# Patient Record
Sex: Male | Born: 1965 | Race: White | Hispanic: No | Marital: Married | Smoking: Current some day smoker
Health system: Southern US, Community
[De-identification: ages and names within clinical notes are randomized; demographics above are authoritative.]

## PROBLEM LIST (undated history)

## (undated) DIAGNOSIS — K5792 Diverticulitis of intestine, part unspecified, without perforation or abscess without bleeding: Secondary | ICD-10-CM

## (undated) DIAGNOSIS — E785 Hyperlipidemia, unspecified: Secondary | ICD-10-CM

## (undated) HISTORY — DX: Diverticulitis of intestine, part unspecified, without perforation or abscess without bleeding: K57.92

## (undated) HISTORY — DX: Hyperlipidemia, unspecified: E78.5

## (undated) HISTORY — PX: WISDOM TOOTH EXTRACTION: SHX21

---

## 2015-07-24 ENCOUNTER — Encounter: Payer: Self-pay | Admitting: Gastroenterology

## 2015-09-28 ENCOUNTER — Encounter: Payer: Self-pay | Admitting: Internal Medicine

## 2015-10-06 ENCOUNTER — Encounter: Payer: Self-pay | Admitting: Gastroenterology

## 2015-11-07 ENCOUNTER — Encounter: Payer: Self-pay | Admitting: Gastroenterology

## 2015-11-23 DIAGNOSIS — M5412 Radiculopathy, cervical region: Secondary | ICD-10-CM | POA: Diagnosis not present

## 2015-11-23 DIAGNOSIS — M791 Myalgia: Secondary | ICD-10-CM | POA: Diagnosis not present

## 2015-11-23 DIAGNOSIS — M9902 Segmental and somatic dysfunction of thoracic region: Secondary | ICD-10-CM | POA: Diagnosis not present

## 2015-11-23 DIAGNOSIS — M9901 Segmental and somatic dysfunction of cervical region: Secondary | ICD-10-CM | POA: Diagnosis not present

## 2015-11-27 DIAGNOSIS — M9901 Segmental and somatic dysfunction of cervical region: Secondary | ICD-10-CM | POA: Diagnosis not present

## 2015-11-27 DIAGNOSIS — M9902 Segmental and somatic dysfunction of thoracic region: Secondary | ICD-10-CM | POA: Diagnosis not present

## 2015-11-27 DIAGNOSIS — M791 Myalgia: Secondary | ICD-10-CM | POA: Diagnosis not present

## 2015-11-27 DIAGNOSIS — M5412 Radiculopathy, cervical region: Secondary | ICD-10-CM | POA: Diagnosis not present

## 2015-11-28 DIAGNOSIS — M542 Cervicalgia: Secondary | ICD-10-CM | POA: Diagnosis not present

## 2016-01-04 ENCOUNTER — Ambulatory Visit (AMBULATORY_SURGERY_CENTER): Payer: Self-pay

## 2016-01-04 ENCOUNTER — Encounter: Payer: Self-pay | Admitting: Gastroenterology

## 2016-01-04 ENCOUNTER — Encounter (INDEPENDENT_AMBULATORY_CARE_PROVIDER_SITE_OTHER): Payer: Self-pay

## 2016-01-04 VITALS — Ht 76.0 in | Wt 224.6 lb

## 2016-01-04 DIAGNOSIS — Z1211 Encounter for screening for malignant neoplasm of colon: Secondary | ICD-10-CM

## 2016-01-04 MED ORDER — SUPREP BOWEL PREP KIT 17.5-3.13-1.6 GM/177ML PO SOLN: 1.0000 | Freq: Once | ORAL | 0 refills | Status: AC

## 2016-01-04 NOTE — Progress Notes (Signed)
No allergies to eggs or soy No past problems with anesthesia No diet meds No home oxygen  Has email and internet; declined emmi 

## 2016-01-09 ENCOUNTER — Ambulatory Visit (AMBULATORY_SURGERY_CENTER): Payer: BLUE CROSS/BLUE SHIELD | Admitting: Gastroenterology

## 2016-01-09 ENCOUNTER — Encounter: Payer: Self-pay | Admitting: Gastroenterology

## 2016-01-09 VITALS — BP 96/66 | HR 61 | Temp 97.8°F | Resp 14 | Ht 76.0 in | Wt 224.0 lb

## 2016-01-09 DIAGNOSIS — Z1211 Encounter for screening for malignant neoplasm of colon: Secondary | ICD-10-CM | POA: Diagnosis not present

## 2016-01-09 MED ORDER — SODIUM CHLORIDE 0.9 % IV SOLN: 500.0000 mL | INTRAVENOUS | Status: DC

## 2016-01-09 NOTE — Op Note (Signed)
[facility] Patient Name: Anthony Shah Procedure Date: 01/09/2016 9:49 AM MRN: [id number] Endoscopist: Sherilyn Cooter L. Myrtie Neither , MD Age: 50 Referring MD:  Date of Birth: 02/13/1966 Gender: Male Account #: [id number] Procedure:                Colonoscopy Indications:              Screening for colorectal malignant neoplasm, This                            is the patient's first colonoscopy Medicines:                Monitored Anesthesia Care Procedure:                Pre-Anesthesia Assessment:                           - Prior to the procedure, a History and Physical                            was performed, and patient medications and                            allergies were reviewed. The patient's tolerance of                            previous anesthesia was also reviewed. The risks                            and benefits of the procedure and the sedation                            options and risks were discussed with the patient.                            All questions were answered, and informed consent                            was obtained. Prior Anticoagulants: The patient has                            taken no previous anticoagulant or antiplatelet                            agents. ASA Grade Assessment: II - A patient with                            mild systemic disease. After reviewing the risks                            and benefits, the patient was deemed in                            satisfactory condition to undergo the procedure.  After obtaining informed consent, the colonoscope                            was passed under direct vision. Throughout the                            procedure, the patient's blood pressure, pulse, and                            oxygen saturations were monitored continuously. The                            Model CF-HQ190L [id number]) scope was introduced                            through the anus and advanced to  the the cecum,                            identified by appendiceal orifice and ileocecal                            valve. The colonoscopy was performed without                            difficulty. The patient tolerated the procedure                            well. The quality of the bowel preparation was                            excellent. The ileocecal valve, appendiceal                            orifice, and rectum were photographed. Scope In: 9:59:25 AM Scope Out: 10:10:39 AM Scope Withdrawal Time: 0 hours 8 minutes 10 seconds  Total Procedure Duration: 0 hours 11 minutes 14 seconds  Findings:                 The perianal and digital rectal examinations were                            normal.                           Internal hemorrhoids were found during                            retroflexion. The hemorrhoids were small and Grade                            I (internal hemorrhoids that do not prolapse).                           The exam was otherwise without abnormality. Complications:            No immediate  complications. Estimated Blood Loss:     Estimated blood loss: none. Impression:               - Internal hemorrhoids.                           - The examination was otherwise normal.                           - No specimens collected. Recommendation:           - Patient has a contact number available for                            emergencies. The signs and symptoms of potential                            delayed complications were discussed with the                            patient. Return to normal activities tomorrow.                            Written discharge instructions were provided to the                            patient.                           - Resume previous diet.                           - Continue present medications.                           - Repeat colonoscopy in 10 years for screening                            purposes. Meshach Perry L.  Myrtie Neither, MD 01/09/2016 10:12:57 AM This report has been signed electronically.

## 2016-01-09 NOTE — Progress Notes (Signed)
To pacu vss paten aw reprort to rn

## 2016-01-09 NOTE — Patient Instructions (Signed)
YOU HAD AN ENDOSCOPIC PROCEDURE TODAY AT THE  ENDOSCOPY CENTER:   Refer to the procedure report that was given to you for any specific questions about what was found during the examination.  If the procedure report does not answer your questions, please call your gastroenterologist to clarify.  If you requested that your care partner not be given the details of your procedure findings, then the procedure report has been included in a sealed envelope for you to review at your convenience later.  YOU SHOULD EXPECT: Some feelings of bloating in the abdomen. Passage of more gas than usual.  Walking can help get rid of the air that was put into your GI tract during the procedure and reduce the bloating. If you had a lower endoscopy (such as a colonoscopy or flexible sigmoidoscopy) you [place] notice spotting of blood in your stool or on the toilet paper. If you underwent a bowel prep for your procedure, you Nix not have a normal bowel movement for a few days.  Please Note:  You might notice some irritation and congestion in your nose or some drainage.  This is from the oxygen used during your procedure.  There is no need for concern and it should clear up in a day or so.  SYMPTOMS TO REPORT IMMEDIATELY:   Following lower endoscopy (colonoscopy or flexible sigmoidoscopy):  Excessive amounts of blood in the stool  Significant tenderness or worsening of abdominal pains  Swelling of the abdomen that is new, acute  Fever of 100F or higher    For urgent or emergent issues, a gastroenterologist can be reached at any hour by calling [phone].   DIET: Your first meal following the procedure should be a small meal and then it is ok to progress to your normal diet. Heavy or fried foods are harder to digest and Buege make you feel nauseous or bloated.  Likewise, meals heavy in dairy and vegetables can increase bloating.  Drink plenty of fluids but you should avoid alcoholic beverages for 24  hours.  ACTIVITY:  You should plan to take it easy for the rest of today and you should NOT DRIVE or use heavy machinery until tomorrow (because of the sedation medicines used during the test).    FOLLOW UP: Our staff will call the number listed on your records the next business day following your procedure to check on you and address any questions or concerns that you [place] have regarding the information given to you following your procedure. If we do not reach you, we will leave a message.  However, if you are feeling well and you are not experiencing any problems, there is no need to return our call.  We will assume that you have returned to your regular daily activities without incident.  If any biopsies were taken you will be contacted by phone or by letter within the next 1-3 weeks.  Please call us at [phone] if you have not heard about the biopsies in 3 weeks.    SIGNATURES/CONFIDENTIALITY: You and/or your care partner have signed paperwork which will be entered into your electronic medical record.  These signatures attest to the fact that that the information above on your After Visit Summary has been reviewed and is understood.  Full responsibility of the confidentiality of this discharge information lies with you and/or your care-partner.   Resume medications. Information given on hemorrhoids and high fiber diet. 

## 2016-01-10 ENCOUNTER — Telehealth: Payer: Self-pay

## 2016-01-10 NOTE — Telephone Encounter (Signed)
  Follow up Call-  Call back number 01/09/2016  Post procedure Call Back phone  # [phone]  Permission to leave phone message Yes  Some recent data might be hidden     Patient questions:  Do you have a fever, pain , or abdominal swelling? No. Pain Score  0 *  Have you tolerated food without any problems? Yes.    Have you been able to return to your normal activities? Yes.    Do you have any questions about your discharge instructions: Diet   No. Medications  No. Follow up visit  No.  Do you have questions or concerns about your Care? No.  Actions: * If pain score is 4 or above: No action needed, pain <4.

## 2016-02-23 ENCOUNTER — Encounter (HOSPITAL_COMMUNITY): Payer: Self-pay

## 2016-02-23 ENCOUNTER — Emergency Department (HOSPITAL_COMMUNITY)
Admission: EM | Admit: 2016-02-23 | Discharge: 2016-02-24 | Disposition: A | Discharge status: Home / Self Care | Payer: BLUE CROSS/BLUE SHIELD | Attending: Emergency Medicine | Admitting: Emergency Medicine

## 2016-02-23 ENCOUNTER — Encounter (HOSPITAL_COMMUNITY): Payer: Self-pay | Admitting: Emergency Medicine

## 2016-02-23 ENCOUNTER — Emergency Department: Payer: BLUE CROSS/BLUE SHIELD

## 2016-02-23 ENCOUNTER — Emergency Department (HOSPITAL_COMMUNITY)
Admit: 2016-02-23 | Discharge: 2016-02-23 | Disposition: A | Discharge status: Home / Self Care | Payer: BLUE CROSS/BLUE SHIELD | Attending: Emergency Medicine | Admitting: Emergency Medicine

## 2016-02-23 ENCOUNTER — Encounter (HOSPITAL_COMMUNITY): Payer: Self-pay | Admitting: Radiology

## 2016-02-23 DIAGNOSIS — K5792 Diverticulitis of intestine, part unspecified, without perforation or abscess without bleeding: Secondary | ICD-10-CM | POA: Diagnosis not present

## 2016-02-23 DIAGNOSIS — F17290 Nicotine dependence, other tobacco product, uncomplicated: Secondary | ICD-10-CM | POA: Diagnosis not present

## 2016-02-23 DIAGNOSIS — R1032 Left lower quadrant pain: Secondary | ICD-10-CM | POA: Diagnosis present

## 2016-02-23 DIAGNOSIS — K5732 Diverticulitis of large intestine without perforation or abscess without bleeding: Secondary | ICD-10-CM

## 2016-02-23 LAB — COMPREHENSIVE METABOLIC PANEL
ALBUMIN: 4.1 g/dL (ref 3.5–5.0)
ALT: 31 U/L (ref 17–63)
AST: 29 U/L (ref 15–41)
Alkaline Phosphatase: 71 U/L (ref 38–126)
Anion gap: 6 (ref 5–15)
BUN: 18 mg/dL (ref 6–20)
CHLORIDE: 106 mmol/L (ref 101–111)
CO2: 28 mmol/L (ref 22–32)
Calcium: 9.1 mg/dL (ref 8.9–10.3)
Creatinine, Ser: 1.22 mg/dL (ref 0.61–1.24)
GFR calc Af Amer: >60 mL/min (ref >60)
GFR calc non Af Amer: >60 mL/min (ref >60)
GLUCOSE: 111 mg/dL — AB (ref 65–99)
POTASSIUM: 4 mmol/L (ref 3.5–5.1)
Sodium: 140 mmol/L (ref 135–145)
Total Bilirubin: 0.7 mg/dL (ref 0.3–1.2)
Total Protein: 7 g/dL (ref 6.5–8.1)

## 2016-02-23 LAB — CBC WITH DIFFERENTIAL/PLATELET
BASOS ABS: 0 10*3/uL (ref 0.0–0.1)
BASOS PCT: 0 %
EOS PCT: 2 %
Eosinophils Absolute: 0.2 10*3/uL (ref 0.0–0.7)
HCT: 43.9 % (ref 39.0–52.0)
Hemoglobin: 15 g/dL (ref 13.0–17.0)
Lymphocytes Relative: 19 %
Lymphs Abs: 2.2 10*3/uL (ref 0.7–4.0)
MCH: 31.8 pg (ref 26.0–34.0)
MCHC: 34.2 g/dL (ref 30.0–36.0)
MCV: 93.2 fL (ref 78.0–100.0)
MONO ABS: 1.4 10*3/uL — AB (ref 0.1–1.0)
Monocytes Relative: 12 %
NEUTROS ABS: 7.7 10*3/uL (ref 1.7–7.7)
Neutrophils Relative %: 67 %
PLATELETS: 133 10*3/uL — AB (ref 150–400)
RBC: 4.71 MIL/uL (ref 4.22–5.81)
RDW: 12.6 % (ref 11.5–15.5)
WBC: 11.4 10*3/uL — ABNORMAL HIGH (ref 4.0–10.5)

## 2016-02-23 LAB — URINALYSIS, ROUTINE W REFLEX MICROSCOPIC
Bilirubin Urine: NEGATIVE
GLUCOSE, UA: NEGATIVE mg/dL
Hgb urine dipstick: NEGATIVE
Ketones, ur: NEGATIVE mg/dL
LEUKOCYTES UA: NEGATIVE
Nitrite: NEGATIVE
PH: 7 (ref 5.0–8.0)
PROTEIN: NEGATIVE mg/dL
Specific Gravity, Urine: 1.021 (ref 1.005–1.030)

## 2016-02-23 LAB — LIPASE, BLOOD: Lipase: 23 U/L (ref 11–51)

## 2016-02-23 MED ORDER — KETOROLAC TROMETHAMINE 30 MG/ML IJ SOLN
30.0000 mg | Freq: Once | INTRAMUSCULAR | Status: AC
  Administered 2016-02-23: 30 mg via INTRAVENOUS
  Filled 2016-02-23: qty 1

## 2016-02-23 MED ORDER — ONDANSETRON HCL 4 MG/2ML IJ SOLN
4.0000 mg | Freq: Once | INTRAMUSCULAR | Status: DC
  Filled 2016-02-23: qty 2

## 2016-02-23 MED ORDER — IOPAMIDOL (ISOVUE-300) INJECTION 61%
15.0000 mL | Freq: Once | INTRAVENOUS | Status: AC | PRN
  Administered 2016-02-24: 15 mL via ORAL

## 2016-02-23 MED ORDER — SODIUM CHLORIDE 0.9 % IV BOLUS (SEPSIS)
1000.0000 mL | Freq: Once | INTRAVENOUS | Status: AC
  Administered 2016-02-23: 1000 mL via INTRAVENOUS

## 2016-02-23 MED ORDER — IOPAMIDOL (ISOVUE-300) INJECTION 61%
100.0000 mL | Freq: Once | INTRAVENOUS | Status: AC | PRN
  Administered 2016-02-24: 100 mL via INTRAVENOUS

## 2016-02-23 NOTE — ED Triage Notes (Signed)
Pt states he has been feeling unwell since Wed.  Started as just being "aware" of some pain that came and went.  Today left lower quadrant  pain has been constant since around lunchtime.  No fevers. No nasuea, vomiting, diarrhea.  Worse with movement.  Chills after dinner tonight.

## 2016-02-23 NOTE — ED Notes (Signed)
RN Ati to draw labs with iv start.

## 2016-02-23 NOTE — ED Provider Notes (Signed)
[facility]-EMERGENCY DEPT Provider Note   CSN: [id number] Arrival date & time: 02/23/16  2036  By signing my name below, I, Christy Sartorius, attest that this documentation has been prepared under the direction and in the presence of  [employer], [place]. Electronically Signed: Christy Sartorius, ED Scribe. 02/23/16. 10:18 PM.  History   Chief Complaint Chief Complaint  Patient presents with  . Abdominal Pain   The history is provided by the patient and medical records. No language interpreter was used.    HPI Comments:  Anthony Shah is a 50 y.o. male with a history of HCL who presents to the Emergency Department complaining of left lower quadrant abdominal pain that beginning 2 days ago.  He describes his pain as cramping and states it became more intense today at 1100 and was worse after dinner. He further notes his pain is achy when he lies still and sharper when he moves. He also reports chills this evening. Two days ago he took an imodium and Tylenol with some relief.  He also endorses taking Naproxen yesterday. His last bowel movement was this morning and he reports it was normal. He reports additional urges to defecate, but could not. He had a colonoscopy done on 01/09/16 and states "it was clean".  He denies fever, melena, hematochezia, nausea, vomiting, hematuria and dysuria.         Past Medical History:  Diagnosis Date  . Hyperlipidemia     There are no active problems to display for this patient.   Past Surgical History:  Procedure Laterality Date  . WISDOM TOOTH EXTRACTION       Home Medications    Prior to Admission medications   Medication Sig Start Date End Date Taking? Authorizing Provider  ezetimibe (ZETIA) 10 MG tablet Take 10 mg by mouth daily.   Yes Historical Provider, MD  rosuvastatin (CRESTOR) 10 MG tablet Take 10 mg by mouth daily.   Yes Historical Provider, MD  zolpidem (AMBIEN) 10 MG tablet Take 10 mg by mouth at bedtime as needed for sleep.   Yes  Historical Provider, MD    Family History Family History  Problem Relation Age of Onset  . Crohn's disease Brother   . Esophageal cancer Paternal Uncle   . Colon cancer Neg Hx     Social History Social History  Substance Use Topics  . Smoking status: Current Some Day Smoker    Types: Cigars  . Smokeless tobacco: Never Used  . Alcohol use 7.8 oz/week    6 Cans of beer, 7 Shots of liquor per week     Allergies   Penicillins   Review of Systems Review of Systems  Gastrointestinal: Positive for abdominal pain.  10 systems reviewed and all are negative for acute change except as noted in the HPI.   Physical Exam Updated Vital Signs BP 123/80 (BP Location: Left Arm)   Pulse 92   Temp 98.9 F (37.2 C) (Oral)   Resp 18   SpO2 99%   Physical Exam  Constitutional: He is oriented to person, place, and time. He appears well-developed and well-nourished. No distress.  Nontoxic and in no distress  HENT:  Head: Normocephalic and atraumatic.  Eyes: Conjunctivae and EOM are normal. No scleral icterus.  Neck: Normal range of motion.  Cardiovascular: Normal rate, regular rhythm and intact distal pulses.   Pulmonary/Chest: Effort normal. No respiratory distress. He has no wheezes. He has no rales.  Respirations even and unlabored. Lungs clear bilaterally.  Abdominal:  Soft. He exhibits no distension and no mass. There is tenderness. There is no rebound.  Soft abdomen with focal tenderness in the left lower quadrant and associated voluntary guarding. No masses.  Musculoskeletal: Normal range of motion.  Neurological: He is alert and oriented to person, place, and time.  Skin: Skin is warm and dry. No rash noted. He is not diaphoretic. No erythema. No pallor.  Psychiatric: He has a normal mood and affect. His behavior is normal.  Nursing note and vitals reviewed.    ED Treatments / Results   DIAGNOSTIC STUDIES:  Oxygen Saturation is 99% on RA, NML by my interpretation.     COORDINATION OF CARE:  10:18 PM  Discussed treatment plan with pt at bedside and pt agreed to plan.  Labs (all labs ordered are listed, but only abnormal results are displayed) Labs Reviewed  CBC WITH DIFFERENTIAL/PLATELET - Abnormal; Notable for the following:       Result Value   WBC 11.4 (*)    Platelets 133 (*)    Monocytes Absolute 1.4 (*)    All other components within normal limits  COMPREHENSIVE METABOLIC PANEL - Abnormal; Notable for the following:    Glucose, Bld 111 (*)    All other components within normal limits  LIPASE, BLOOD  URINALYSIS, ROUTINE W REFLEX MICROSCOPIC ([facility])    EKG  EKG Interpretation None       Radiology Ct Abdomen Pelvis W Contrast  Result Date: 02/24/2016 CLINICAL DATA:  Left lower quadrant pain. EXAM: CT ABDOMEN AND PELVIS WITH CONTRAST TECHNIQUE: Multidetector CT imaging of the abdomen and pelvis was performed using the standard protocol following bolus administration of intravenous contrast. CONTRAST:  100mL ISOVUE-300 IOPAMIDOL (ISOVUE-300) IV COMPARISON:  None. FINDINGS: Lower chest: A 2.2 cm nodule in the medial right lower lobe with central popcorn calcification is consistent with a hamartoma. No pleural fluid or acute abnormality. Hepatobiliary: 2.7 cm gallstone in the gallbladder, no pericholecystic inflammation. No biliary dilatation. No focal hepatic lesion. Pancreas: No ductal dilatation or inflammation. Spleen: Normal in size without focal abnormality. Adrenals/Urinary Tract: Normal adrenal glands. Symmetric renal enhancement and excretion. No perinephric edema. No hydronephrosis. Bladder is physiologically distended without stone. Stomach/Bowel: Inflamed diverticulum with adjacent inflammatory soft tissue stranding and colonic wall thickening is seen at the junction of the descending and sigmoid colon. Minimal adjacent free fluid. There is no perforation or abscess. Additional noninflamed colonic diverticula are seen most  prominent distally. Trace hiatal hernia. Stomach and small bowel are otherwise normal. The appendix is normal. Vascular/Lymphatic: Aortic atherosclerosis. No enlarged abdominal or pelvic lymph nodes. Reproductive: Prostate is unremarkable. Other: No free air or free fluid. Tiny fat containing umbilical hernia. Musculoskeletal: There are no acute or suspicious osseous abnormalities. IMPRESSION: 1. Acute uncomplicated diverticulitis at the junction of the descending and sigmoid colon. 2. Incidental gallstone without gallbladder inflammation. 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 Electronically Signed   By: Rubye OaksMelanie  Ehinger M.D.   On: 02/24/2016 00:40    Procedures Procedures (including critical care time)  Medications Ordered in ED Medications  ondansetron [facility](ZOFRAN) injection 4 mg (4 mg Intravenous Refused 02/23/16 2227)  sodium chloride 0.9 % bolus 1,000 mL (0 mLs Intravenous Stopped 02/23/16 2308)  ketorolac (TORADOL) 30 MG/ML injection 30 mg (30 mg Intravenous Given 02/23/16 2227)     Initial Impression / Assessment and Plan / ED Course  I have reviewed the triage vital signs and the nursing notes.  Pertinent  labs & imaging results that were available during my care of the patient were reviewed by me and considered in my medical decision making (see chart for details).  Clinical Course    50 year old male present to the emergency department for left lower quadrant abdominal pain. He is afebrile with stable vital signs. Patient with mild leukocytosis and CT findings consistent with acute, uncomplicated diverticulitis. Pain well controlled with Toradol given in the emergency department. Plan to manage on an outpatient basis with ciprofloxacin and Flagyl. Primary care follow up advised and return precautions given. Patient discharged in satisfactory condition with no unaddressed concerns.   Final Clinical Impressions(s) / ED Diagnoses   Final  diagnoses:  Diverticulitis of large intestine without perforation or abscess without bleeding    New Prescriptions New Prescriptions   No medications on file    I personally performed the services described in this documentation, which was scribed in my presence. The recorded information has been reviewed and is accurate.       Antony Madura, PA-C 02/24/16 0234    Rolland Porter, MD 03/08/16 [phone]

## 2016-02-24 DIAGNOSIS — K5792 Diverticulitis of intestine, part unspecified, without perforation or abscess without bleeding: Secondary | ICD-10-CM | POA: Diagnosis not present

## 2016-02-24 MED ORDER — CIPROFLOXACIN HCL 500 MG PO TABS
500.0000 mg | ORAL_TABLET | Freq: Once | ORAL | Status: AC
  Administered 2016-02-24: 500 mg via ORAL
  Filled 2016-02-24: qty 1

## 2016-02-24 MED ORDER — CIPROFLOXACIN HCL 500 MG PO TABS: 500.0000 mg | ORAL_TABLET | Freq: Two times a day (BID) | ORAL | 0 refills | Status: DC

## 2016-02-24 MED ORDER — METRONIDAZOLE 500 MG PO TABS
500.0000 mg | ORAL_TABLET | Freq: Once | ORAL | Status: AC
  Administered 2016-02-24: 500 mg via ORAL
  Filled 2016-02-24: qty 1

## 2016-02-24 MED ORDER — METRONIDAZOLE 500 MG PO TABS: 500.0000 mg | ORAL_TABLET | Freq: Three times a day (TID) | ORAL | 0 refills | Status: DC

## 2016-02-27 DIAGNOSIS — Z6827 Body mass index (BMI) 27.0-27.9, adult: Secondary | ICD-10-CM | POA: Diagnosis not present

## 2016-02-27 DIAGNOSIS — K5792 Diverticulitis of intestine, part unspecified, without perforation or abscess without bleeding: Secondary | ICD-10-CM | POA: Diagnosis not present

## 2016-03-20 ENCOUNTER — Telehealth: Payer: Self-pay | Admitting: Gastroenterology

## 2016-03-20 MED ORDER — METRONIDAZOLE 500 MG PO TABS: 500.0000 mg | ORAL_TABLET | Freq: Three times a day (TID) | ORAL | 0 refills | Status: DC

## 2016-03-20 MED ORDER — CIPROFLOXACIN HCL 500 MG PO TABS: 500.0000 mg | ORAL_TABLET | Freq: Two times a day (BID) | ORAL | 0 refills | Status: DC

## 2016-03-20 NOTE — Telephone Encounter (Signed)
Spoke with pt and he is aware. Pt scheduled to see Mike GipAmy Esterwood PA 04/01/16@3pm , scripts sent to pharmacy.

## 2016-03-20 NOTE — Telephone Encounter (Signed)
Sorry to hear he is not feeling well.  I reviewed his labs and CT scan report from that day.  He Lory have not completely cleared the diverticulitis with that Abx course.  Please prescribe ciprofloxacin 500 mg, one tablet twice daily x 10 days and metronidazole 500 mg, one tablet three times daily x 10 days. I will be out of the office next week and the week after.  Please put him in to see one of the APPs during that time.

## 2016-03-20 NOTE — Telephone Encounter (Signed)
Pt was seen in the ER 02/23/16 for diverticulitis and given cipro and flagyl for 10 days. He states that when he was seen he was having LLQ pain, he did not have any fever. Pt has finished the meds but is concerned because he continues to have some discomfort on his left side however it is higher and under his rib cage, states it sometimes goes around to his back. Pt wants to know what he should do. Please advise.

## 2016-04-01 ENCOUNTER — Encounter: Payer: Self-pay | Admitting: Physician Assistant

## 2016-04-01 ENCOUNTER — Ambulatory Visit (INDEPENDENT_AMBULATORY_CARE_PROVIDER_SITE_OTHER): Payer: BLUE CROSS/BLUE SHIELD | Admitting: Physician Assistant

## 2016-04-01 ENCOUNTER — Encounter (INDEPENDENT_AMBULATORY_CARE_PROVIDER_SITE_OTHER): Payer: Self-pay

## 2016-04-01 VITALS — BP 100/60 | HR 70 | Ht 76.0 in | Wt 218.0 lb

## 2016-04-01 DIAGNOSIS — K5732 Diverticulitis of large intestine without perforation or abscess without bleeding: Secondary | ICD-10-CM | POA: Diagnosis not present

## 2016-04-01 NOTE — Progress Notes (Signed)
Thank you for sending this case to me. I have reviewed the entire note, and the outlined plan seems appropriate.  

## 2016-04-01 NOTE — Progress Notes (Signed)
Subjective:    Patient ID: Anthony Shah, male    DOB: 01-Jul-1965, 50 y.o.   MRN: [id number]  HPI Arlys John is a pleasant 50 year old white male recently established with Dr. Myrtie Neither. He had undergone screening colonoscopy on 01/09/2016 which was  a negative  exam with the exception of internal hemorrhoids. Patient had an ER visit on 02/23/2016 with acute left lower abdominal pain 2 days, achy  in nature, constant and associated with chills. He underwent CT of the abdomen and pelvis which showed a 2.2 cm nodule in the right upper lobe consistent with a hamartoma and a 2.7 cm gallstone in the gallbladder without evidence of gallbladder wall thickening or pericholecystic fluid. He was noted to have an inflamed diverticulum with adjacent inflammatory soft tissue stranding at the junction of the descending and sigmoid colon consistent with diverticulitis. He also has a tiny fat containing umbilical hernia. Prescribed a 10 day course of Cipro and Flagyl which she completed. He says the Flagyl made him feel bad and gave him a bad  taste in his mouth. He called here on 03/20/2016 stating that he was still having some lower abdominal discomfort though much better than prior to the antibiotics. He was called in  an additional 10 days worth of Cipro and Flagyl which he  since completed. He says he feels much better at this point and is not having any abdominal discomfort. He has multiple questions regarding diverticulosis/diverticulitis and management.  Review of Systems Pertinent positive and negative review of systems were noted in the above HPI section.  All other review of systems was otherwise negative.  Outpatient Encounter Prescriptions as of 04/01/2016  Medication Sig  . ezetimibe (ZETIA) 10 MG tablet Take 10 mg by mouth daily.  . rosuvastatin (CRESTOR) 10 MG tablet Take 10 mg by mouth daily.  Marland Kitchen zolpidem (AMBIEN) 10 MG tablet Take 10 mg by mouth at bedtime as needed for sleep.  . [DISCONTINUED] ciprofloxacin  (CIPRO) 500 MG tablet Take 1 tablet (500 mg total) by mouth every 12 (twelve) hours.  . [DISCONTINUED] ciprofloxacin (CIPRO) 500 MG tablet Take 1 tablet (500 mg total) by mouth 2 (two) times daily.  . [DISCONTINUED] metroNIDAZOLE (FLAGYL) 500 MG tablet Take 1 tablet (500 mg total) by mouth 3 (three) times daily.  . [DISCONTINUED] metroNIDAZOLE (FLAGYL) 500 MG tablet Take 1 tablet (500 mg total) by mouth 3 (three) times daily.  . [DISCONTINUED] 0.9 %  sodium chloride infusion    No facility-administered encounter medications on file as of 04/01/2016.    Allergies  Allergen Reactions  . Penicillins Rash   There are no active problems to display for this patient.  Social History   Social History  . Marital status: Married    Spouse name: N/A  . Number of children: 4  . Years of education: N/A   Occupational History  . Scientist, research (medical)    Social History Main Topics  . Smoking status: Current Some Day Smoker    Types: Cigars  . Smokeless tobacco: Never Used  . Alcohol use 7.8 oz/week    6 Cans of beer, 7 Shots of liquor per week  . Drug use: No  . Sexual activity: Not on file   Other Topics Concern  . Not on file   Social History Narrative  . No narrative on file    Mr. Lakins family history includes Crohn's disease in his brother; Esophageal cancer in his paternal uncle.      Objective:  Vitals:   04/01/16 1507  BP: 100/60  Pulse: 70    Physical Exam  well-developed white male in no acute distress, pleasant blood pressure 100/60 pulse 70 BMI 26.5. HEENT nontraumatic normocephalic EOMI PERRLA sclera anicteric, Cardiovascular; regular rate and rhythm with S1-S2 no murmur rub or gallop, Pulmonary ;clear bilaterally, Abdomen; soft, nontender nondistended bowel sounds are active there is no palpable mass or hepatosplenomegaly, Rectal ;exam not done, Ext; no clubbing cyanosis or edema skin warm and dry, Neuropsych ;mood and affect appropriate       Assessment &  Plan:   #181  50 year old white male with the initial episode of acute diverticulitis sigmoid. Symptoms completely resolved after a 3 week course of antibiotics #2 single  large gallstone noted on CT  Plan; discussion today regarding diverticulosis, diverticulitis, and general management with high fiber diet, fiber supplementation, avoiding constipation. We discussed symptoms, and patient is aware he should call for any concerns regarding recurrent episodes. We also discussed cholelithiasis. He  has not had any symptoms that he is aware of. Discussed symptoms of biliary colic. For now watchful waiting is appropriate, surgical consultation should he develop any symptoms. He will follow up with Dr. Myrtie Neitheranis  or myself on an as-needed basis.  Merrianne Mccumbers Oswald HillockS Turon Kilmer PA-C 04/01/2016   Cc: Martha ClanShaw, William, MD

## 2016-04-01 NOTE — Patient Instructions (Signed)
Call office for any problems with recurrent diverticulitis.   We have provided you with pamphlets on Diveritulitis and one on Gallstones.

## 2016-07-16 DIAGNOSIS — Z125 Encounter for screening for malignant neoplasm of prostate: Secondary | ICD-10-CM | POA: Diagnosis not present

## 2016-07-16 DIAGNOSIS — R52 Pain, unspecified: Secondary | ICD-10-CM | POA: Diagnosis not present

## 2016-07-16 DIAGNOSIS — R8299 Other abnormal findings in urine: Secondary | ICD-10-CM | POA: Diagnosis not present

## 2016-07-16 DIAGNOSIS — Z0000 Encounter for general adult medical examination without abnormal findings: Secondary | ICD-10-CM | POA: Diagnosis not present

## 2016-07-23 DIAGNOSIS — E784 Other hyperlipidemia: Secondary | ICD-10-CM | POA: Diagnosis not present

## 2016-07-23 DIAGNOSIS — K219 Gastro-esophageal reflux disease without esophagitis: Secondary | ICD-10-CM | POA: Diagnosis not present

## 2016-07-23 DIAGNOSIS — I471 Supraventricular tachycardia: Secondary | ICD-10-CM | POA: Diagnosis not present

## 2016-07-23 DIAGNOSIS — Z0000 Encounter for general adult medical examination without abnormal findings: Secondary | ICD-10-CM | POA: Diagnosis not present

## 2016-07-23 DIAGNOSIS — Z1389 Encounter for screening for other disorder: Secondary | ICD-10-CM | POA: Diagnosis not present

## 2016-07-23 DIAGNOSIS — Z8719 Personal history of other diseases of the digestive system: Secondary | ICD-10-CM | POA: Diagnosis not present

## 2016-08-02 DIAGNOSIS — Z1212 Encounter for screening for malignant neoplasm of rectum: Secondary | ICD-10-CM | POA: Diagnosis not present

## 2016-12-12 DIAGNOSIS — M542 Cervicalgia: Secondary | ICD-10-CM | POA: Diagnosis not present

## 2017-01-16 DIAGNOSIS — M542 Cervicalgia: Secondary | ICD-10-CM | POA: Diagnosis not present

## 2017-01-17 DIAGNOSIS — M9902 Segmental and somatic dysfunction of thoracic region: Secondary | ICD-10-CM | POA: Diagnosis not present

## 2017-01-17 DIAGNOSIS — M5412 Radiculopathy, cervical region: Secondary | ICD-10-CM | POA: Diagnosis not present

## 2017-01-17 DIAGNOSIS — M9901 Segmental and somatic dysfunction of cervical region: Secondary | ICD-10-CM | POA: Diagnosis not present

## 2017-01-17 DIAGNOSIS — M791 Myalgia: Secondary | ICD-10-CM | POA: Diagnosis not present

## 2017-01-28 DIAGNOSIS — M9901 Segmental and somatic dysfunction of cervical region: Secondary | ICD-10-CM | POA: Diagnosis not present

## 2017-01-28 DIAGNOSIS — M5412 Radiculopathy, cervical region: Secondary | ICD-10-CM | POA: Diagnosis not present

## 2017-01-28 DIAGNOSIS — M791 Myalgia: Secondary | ICD-10-CM | POA: Diagnosis not present

## 2017-01-28 DIAGNOSIS — M9902 Segmental and somatic dysfunction of thoracic region: Secondary | ICD-10-CM | POA: Diagnosis not present

## 2017-02-06 DIAGNOSIS — M9902 Segmental and somatic dysfunction of thoracic region: Secondary | ICD-10-CM | POA: Diagnosis not present

## 2017-02-06 DIAGNOSIS — M9901 Segmental and somatic dysfunction of cervical region: Secondary | ICD-10-CM | POA: Diagnosis not present

## 2017-02-06 DIAGNOSIS — M5412 Radiculopathy, cervical region: Secondary | ICD-10-CM | POA: Diagnosis not present

## 2017-02-06 DIAGNOSIS — M791 Myalgia: Secondary | ICD-10-CM | POA: Diagnosis not present

## 2017-02-13 DIAGNOSIS — M5412 Radiculopathy, cervical region: Secondary | ICD-10-CM | POA: Diagnosis not present

## 2017-02-13 DIAGNOSIS — M9902 Segmental and somatic dysfunction of thoracic region: Secondary | ICD-10-CM | POA: Diagnosis not present

## 2017-02-13 DIAGNOSIS — M791 Myalgia: Secondary | ICD-10-CM | POA: Diagnosis not present

## 2017-02-13 DIAGNOSIS — M9901 Segmental and somatic dysfunction of cervical region: Secondary | ICD-10-CM | POA: Diagnosis not present

## 2017-02-17 DIAGNOSIS — M542 Cervicalgia: Secondary | ICD-10-CM | POA: Diagnosis not present

## 2017-02-20 DIAGNOSIS — M542 Cervicalgia: Secondary | ICD-10-CM | POA: Diagnosis not present

## 2017-02-25 DIAGNOSIS — M9901 Segmental and somatic dysfunction of cervical region: Secondary | ICD-10-CM | POA: Diagnosis not present

## 2017-02-25 DIAGNOSIS — M5412 Radiculopathy, cervical region: Secondary | ICD-10-CM | POA: Diagnosis not present

## 2017-02-25 DIAGNOSIS — M791 Myalgia: Secondary | ICD-10-CM | POA: Diagnosis not present

## 2017-02-25 DIAGNOSIS — M9902 Segmental and somatic dysfunction of thoracic region: Secondary | ICD-10-CM | POA: Diagnosis not present

## 2017-02-27 DIAGNOSIS — M542 Cervicalgia: Secondary | ICD-10-CM | POA: Diagnosis not present

## 2017-05-22 DIAGNOSIS — M542 Cervicalgia: Secondary | ICD-10-CM | POA: Diagnosis not present

## 2017-06-05 DIAGNOSIS — M542 Cervicalgia: Secondary | ICD-10-CM | POA: Diagnosis not present

## 2017-06-11 DIAGNOSIS — M50323 Other cervical disc degeneration at C6-C7 level: Secondary | ICD-10-CM | POA: Diagnosis not present

## 2017-06-11 DIAGNOSIS — M50322 Other cervical disc degeneration at C5-C6 level: Secondary | ICD-10-CM | POA: Diagnosis not present

## 2017-06-11 DIAGNOSIS — M5011 Cervical disc disorder with radiculopathy,  high cervical region: Secondary | ICD-10-CM | POA: Diagnosis not present

## 2017-06-16 ENCOUNTER — Other Ambulatory Visit: Payer: Self-pay | Admitting: Orthopedic Surgery

## 2017-06-16 DIAGNOSIS — M542 Cervicalgia: Secondary | ICD-10-CM

## 2017-06-19 ENCOUNTER — Ambulatory Visit
Admission: RE | Admit: 2017-06-19 | Discharge: 2017-06-19 | Disposition: A | Discharge status: Home / Self Care | Payer: BLUE CROSS/BLUE SHIELD | Source: Ambulatory Visit | Attending: Orthopedic Surgery | Admitting: Orthopedic Surgery

## 2017-06-19 DIAGNOSIS — M542 Cervicalgia: Secondary | ICD-10-CM

## 2017-06-19 DIAGNOSIS — M4802 Spinal stenosis, cervical region: Secondary | ICD-10-CM | POA: Diagnosis not present

## 2017-06-24 DIAGNOSIS — M5124 Other intervertebral disc displacement, thoracic region: Secondary | ICD-10-CM | POA: Diagnosis not present

## 2017-06-24 DIAGNOSIS — M4722 Other spondylosis with radiculopathy, cervical region: Secondary | ICD-10-CM | POA: Diagnosis not present

## 2017-06-24 DIAGNOSIS — M5414 Radiculopathy, thoracic region: Secondary | ICD-10-CM | POA: Diagnosis not present

## 2017-06-24 DIAGNOSIS — M542 Cervicalgia: Secondary | ICD-10-CM | POA: Diagnosis not present

## 2017-07-17 DIAGNOSIS — M542 Cervicalgia: Secondary | ICD-10-CM | POA: Diagnosis not present

## 2017-07-22 DIAGNOSIS — M542 Cervicalgia: Secondary | ICD-10-CM | POA: Diagnosis not present

## 2017-07-31 DIAGNOSIS — M542 Cervicalgia: Secondary | ICD-10-CM | POA: Diagnosis not present

## 2017-08-06 DIAGNOSIS — M542 Cervicalgia: Secondary | ICD-10-CM | POA: Diagnosis not present

## 2017-08-08 DIAGNOSIS — M542 Cervicalgia: Secondary | ICD-10-CM | POA: Diagnosis not present

## 2017-08-21 DIAGNOSIS — M542 Cervicalgia: Secondary | ICD-10-CM | POA: Diagnosis not present

## 2017-09-05 DIAGNOSIS — M4722 Other spondylosis with radiculopathy, cervical region: Secondary | ICD-10-CM | POA: Diagnosis not present

## 2017-10-10 DIAGNOSIS — E7849 Other hyperlipidemia: Secondary | ICD-10-CM | POA: Diagnosis not present

## 2017-10-10 DIAGNOSIS — Z125 Encounter for screening for malignant neoplasm of prostate: Secondary | ICD-10-CM | POA: Diagnosis not present

## 2017-10-10 DIAGNOSIS — Z0000 Encounter for general adult medical examination without abnormal findings: Secondary | ICD-10-CM | POA: Diagnosis not present

## 2017-10-17 DIAGNOSIS — Z125 Encounter for screening for malignant neoplasm of prostate: Secondary | ICD-10-CM | POA: Diagnosis not present

## 2017-10-17 DIAGNOSIS — Z0000 Encounter for general adult medical examination without abnormal findings: Secondary | ICD-10-CM | POA: Diagnosis not present

## 2017-10-17 DIAGNOSIS — Z1389 Encounter for screening for other disorder: Secondary | ICD-10-CM | POA: Diagnosis not present

## 2017-10-17 DIAGNOSIS — K219 Gastro-esophageal reflux disease without esophagitis: Secondary | ICD-10-CM | POA: Diagnosis not present

## 2017-10-17 DIAGNOSIS — Z8719 Personal history of other diseases of the digestive system: Secondary | ICD-10-CM | POA: Diagnosis not present

## 2017-10-17 DIAGNOSIS — E7849 Other hyperlipidemia: Secondary | ICD-10-CM | POA: Diagnosis not present

## 2017-10-17 DIAGNOSIS — I471 Supraventricular tachycardia: Secondary | ICD-10-CM | POA: Diagnosis not present

## 2017-10-20 DIAGNOSIS — Z1212 Encounter for screening for malignant neoplasm of rectum: Secondary | ICD-10-CM | POA: Diagnosis not present

## 2017-12-05 DIAGNOSIS — H5213 Myopia, bilateral: Secondary | ICD-10-CM | POA: Diagnosis not present

## 2018-01-09 DIAGNOSIS — M542 Cervicalgia: Secondary | ICD-10-CM | POA: Diagnosis not present

## 2018-01-14 DIAGNOSIS — M542 Cervicalgia: Secondary | ICD-10-CM | POA: Diagnosis not present

## 2018-01-22 DIAGNOSIS — M542 Cervicalgia: Secondary | ICD-10-CM | POA: Diagnosis not present

## 2018-01-28 DIAGNOSIS — M542 Cervicalgia: Secondary | ICD-10-CM | POA: Diagnosis not present

## 2018-02-19 DIAGNOSIS — M542 Cervicalgia: Secondary | ICD-10-CM | POA: Diagnosis not present

## 2018-03-05 DIAGNOSIS — M542 Cervicalgia: Secondary | ICD-10-CM | POA: Diagnosis not present

## 2018-03-19 DIAGNOSIS — M542 Cervicalgia: Secondary | ICD-10-CM | POA: Diagnosis not present

## 2018-05-14 DIAGNOSIS — M542 Cervicalgia: Secondary | ICD-10-CM | POA: Diagnosis not present

## 2018-05-21 DIAGNOSIS — M542 Cervicalgia: Secondary | ICD-10-CM | POA: Diagnosis not present

## 2018-10-16 DIAGNOSIS — Z0000 Encounter for general adult medical examination without abnormal findings: Secondary | ICD-10-CM | POA: Diagnosis not present

## 2018-10-16 DIAGNOSIS — E7849 Other hyperlipidemia: Secondary | ICD-10-CM | POA: Diagnosis not present

## 2018-10-16 DIAGNOSIS — Z125 Encounter for screening for malignant neoplasm of prostate: Secondary | ICD-10-CM | POA: Diagnosis not present

## 2018-10-23 DIAGNOSIS — E785 Hyperlipidemia, unspecified: Secondary | ICD-10-CM | POA: Diagnosis not present

## 2018-10-23 DIAGNOSIS — Z8719 Personal history of other diseases of the digestive system: Secondary | ICD-10-CM | POA: Diagnosis not present

## 2018-10-23 DIAGNOSIS — Z0000 Encounter for general adult medical examination without abnormal findings: Secondary | ICD-10-CM | POA: Diagnosis not present

## 2018-10-23 DIAGNOSIS — F5221 Male erectile disorder: Secondary | ICD-10-CM | POA: Diagnosis not present

## 2018-10-23 DIAGNOSIS — I471 Supraventricular tachycardia: Secondary | ICD-10-CM | POA: Diagnosis not present

## 2018-10-23 DIAGNOSIS — Z1331 Encounter for screening for depression: Secondary | ICD-10-CM | POA: Diagnosis not present

## 2018-12-02 DIAGNOSIS — Z1159 Encounter for screening for other viral diseases: Secondary | ICD-10-CM | POA: Diagnosis not present

## 2019-01-21 DIAGNOSIS — M542 Cervicalgia: Secondary | ICD-10-CM | POA: Diagnosis not present

## 2019-03-22 DIAGNOSIS — S82832D Other fracture of upper and lower end of left fibula, subsequent encounter for closed fracture with routine healing: Secondary | ICD-10-CM | POA: Diagnosis not present

## 2019-04-08 DIAGNOSIS — M542 Cervicalgia: Secondary | ICD-10-CM | POA: Diagnosis not present

## 2019-04-15 DIAGNOSIS — M542 Cervicalgia: Secondary | ICD-10-CM | POA: Diagnosis not present

## 2019-04-30 ENCOUNTER — Other Ambulatory Visit: Payer: Self-pay

## 2019-04-30 DIAGNOSIS — Z20822 Contact with and (suspected) exposure to covid-19: Secondary | ICD-10-CM

## 2019-05-02 DIAGNOSIS — Z20828 Contact with and (suspected) exposure to other viral communicable diseases: Secondary | ICD-10-CM | POA: Diagnosis not present

## 2019-05-03 LAB — NOVEL CORONAVIRUS, NAA: SARS-CoV-2, NAA: NOT DETECTED

## 2019-05-31 DIAGNOSIS — Z20828 Contact with and (suspected) exposure to other viral communicable diseases: Secondary | ICD-10-CM | POA: Diagnosis not present

## 2019-06-02 ENCOUNTER — Ambulatory Visit: Payer: BC Managed Care – PPO | Attending: Internal Medicine

## 2019-06-02 DIAGNOSIS — R238 Other skin changes: Secondary | ICD-10-CM

## 2019-06-02 DIAGNOSIS — U071 COVID-19: Secondary | ICD-10-CM

## 2019-06-03 LAB — NOVEL CORONAVIRUS, NAA: SARS-CoV-2, NAA: NOT DETECTED

## 2019-06-16 ENCOUNTER — Ambulatory Visit: Payer: BC Managed Care – PPO | Attending: Internal Medicine

## 2019-06-16 DIAGNOSIS — Z20822 Contact with and (suspected) exposure to covid-19: Secondary | ICD-10-CM | POA: Diagnosis not present

## 2019-06-17 LAB — NOVEL CORONAVIRUS, NAA: SARS-CoV-2, NAA: NOT DETECTED

## 2019-09-03 DIAGNOSIS — M542 Cervicalgia: Secondary | ICD-10-CM | POA: Diagnosis not present

## 2019-09-10 DIAGNOSIS — M542 Cervicalgia: Secondary | ICD-10-CM | POA: Diagnosis not present

## 2019-10-07 DIAGNOSIS — H52223 Regular astigmatism, bilateral: Secondary | ICD-10-CM | POA: Diagnosis not present

## 2019-10-07 DIAGNOSIS — H5213 Myopia, bilateral: Secondary | ICD-10-CM | POA: Diagnosis not present

## 2019-10-22 DIAGNOSIS — Z125 Encounter for screening for malignant neoplasm of prostate: Secondary | ICD-10-CM | POA: Diagnosis not present

## 2019-10-22 DIAGNOSIS — Z0000 Encounter for general adult medical examination without abnormal findings: Secondary | ICD-10-CM | POA: Diagnosis not present

## 2019-10-22 DIAGNOSIS — E7849 Other hyperlipidemia: Secondary | ICD-10-CM | POA: Diagnosis not present

## 2019-10-23 DIAGNOSIS — M542 Cervicalgia: Secondary | ICD-10-CM | POA: Diagnosis not present

## 2019-10-28 DIAGNOSIS — Z0000 Encounter for general adult medical examination without abnormal findings: Secondary | ICD-10-CM | POA: Diagnosis not present

## 2019-10-28 DIAGNOSIS — I471 Supraventricular tachycardia: Secondary | ICD-10-CM | POA: Diagnosis not present

## 2019-10-28 DIAGNOSIS — E785 Hyperlipidemia, unspecified: Secondary | ICD-10-CM | POA: Diagnosis not present

## 2019-10-28 DIAGNOSIS — Z1331 Encounter for screening for depression: Secondary | ICD-10-CM | POA: Diagnosis not present

## 2019-10-28 DIAGNOSIS — R7301 Impaired fasting glucose: Secondary | ICD-10-CM | POA: Diagnosis not present

## 2019-10-28 DIAGNOSIS — Z8719 Personal history of other diseases of the digestive system: Secondary | ICD-10-CM | POA: Diagnosis not present

## 2019-11-03 DIAGNOSIS — Z1212 Encounter for screening for malignant neoplasm of rectum: Secondary | ICD-10-CM | POA: Diagnosis not present

## 2019-11-12 DIAGNOSIS — M542 Cervicalgia: Secondary | ICD-10-CM | POA: Diagnosis not present

## 2019-12-11 IMAGING — MR MR CERVICAL SPINE W/O CM
5 series · 29 of 48 positions shown · non-contrast
Comparison: MRI cervical spine 10/28/2007

CLINICAL DATA: Neck pain.  Left arm and hand numbness

EXAM:
MRI CERVICAL SPINE WITHOUT CONTRAST
TECHNIQUE: Multiplanar, multisequence MR imaging of the cervical spine was
performed. No intravenous contrast was administered.

[Series 2: T2 · sagittal · 3.3mm · 0.41mm/px · 6 of 13 slices shown (1 of 2)]
[im 1/13]
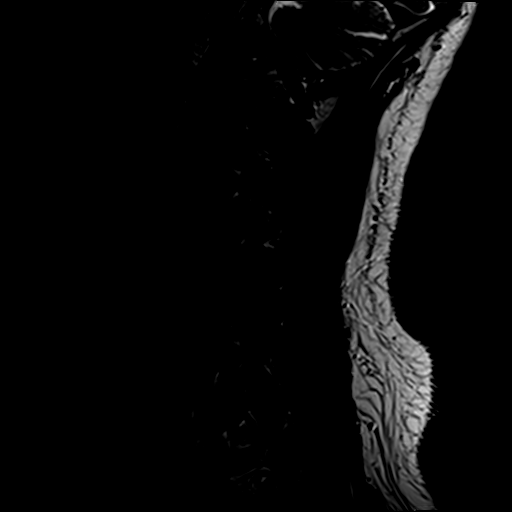
[im 3/13]
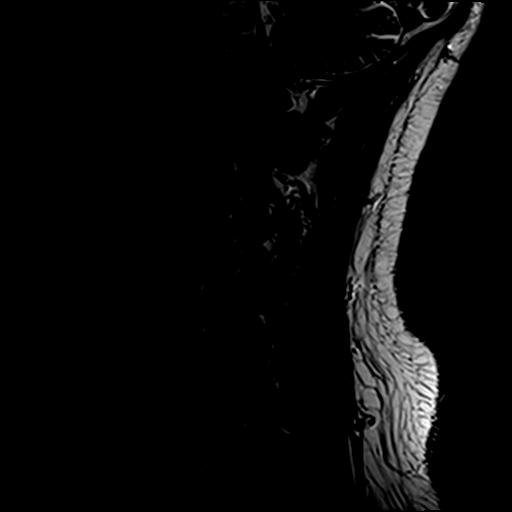
[im 5/13]
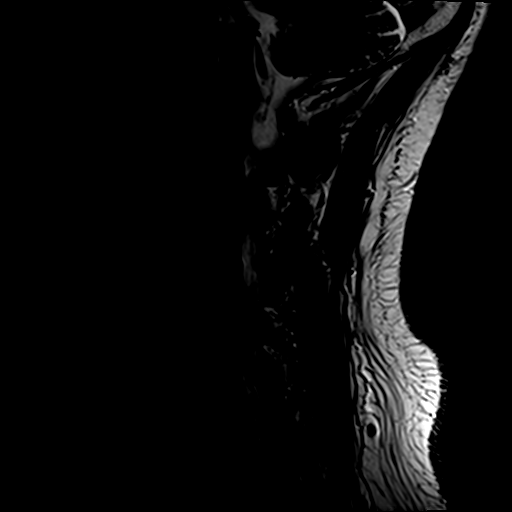
[im 8/13]
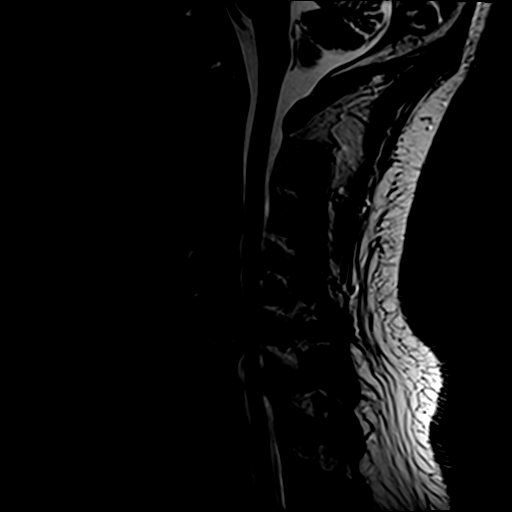
[im 10/13]
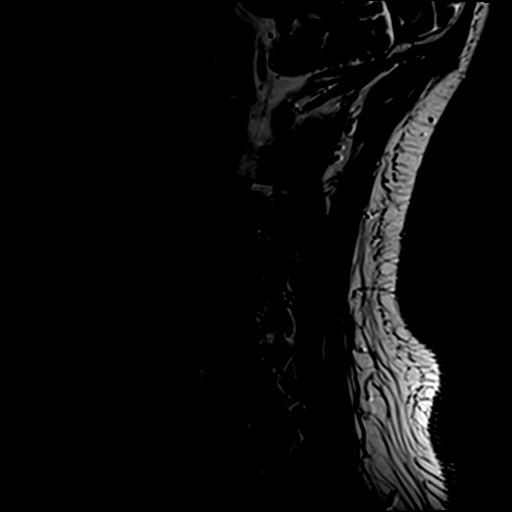
[im 13/13]
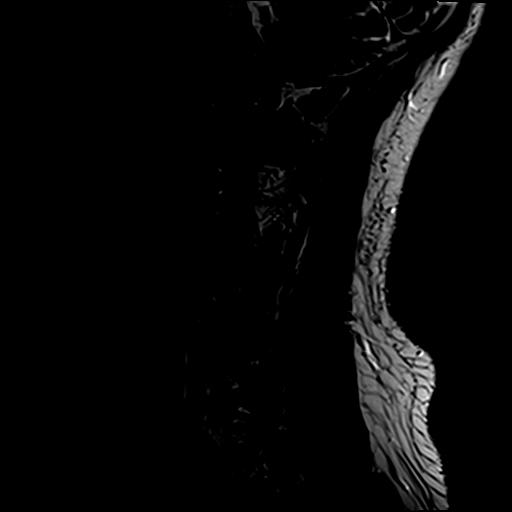

[Series 3: T1 · sagittal · 3.3mm · 0.41mm/px · 6 of 13 slices shown]
[im 1/13]
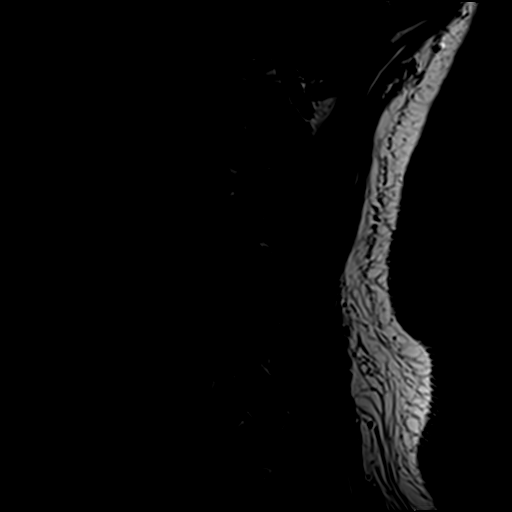
[im 3/13]
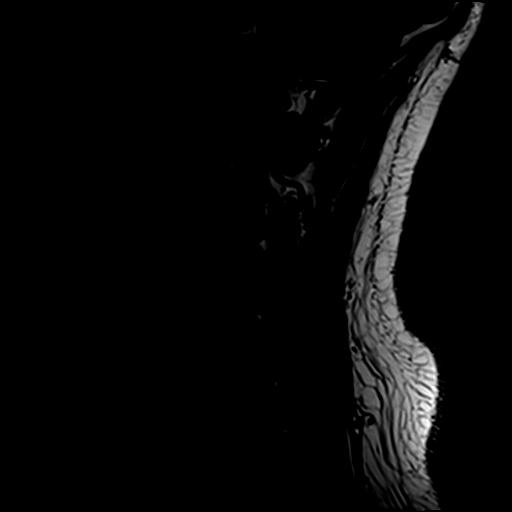
[im 5/13]
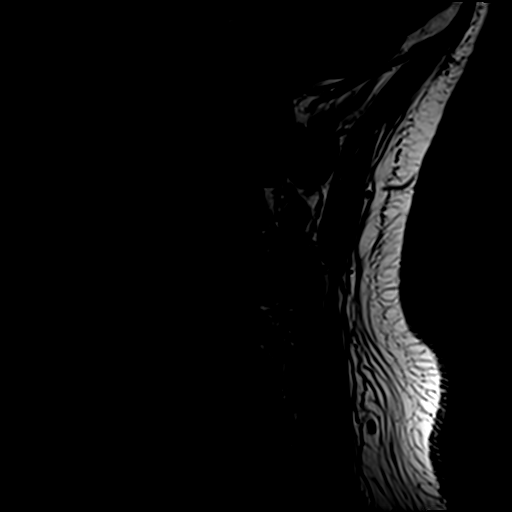
[im 8/13]
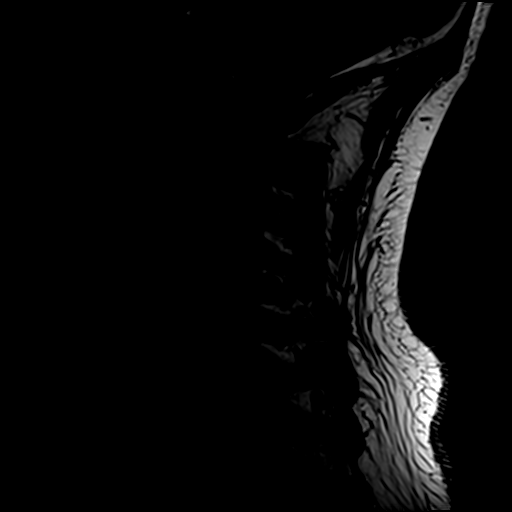
[im 10/13]
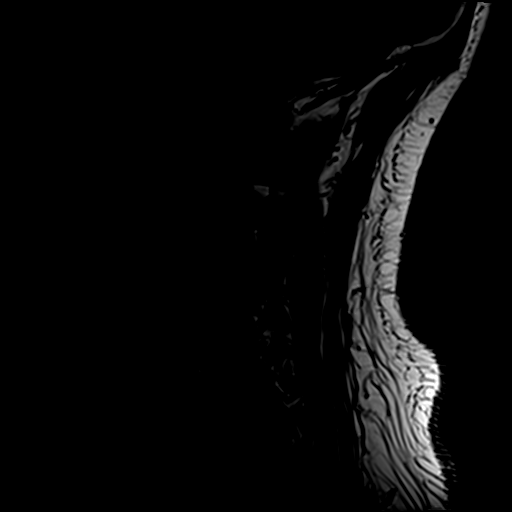
[im 13/13]
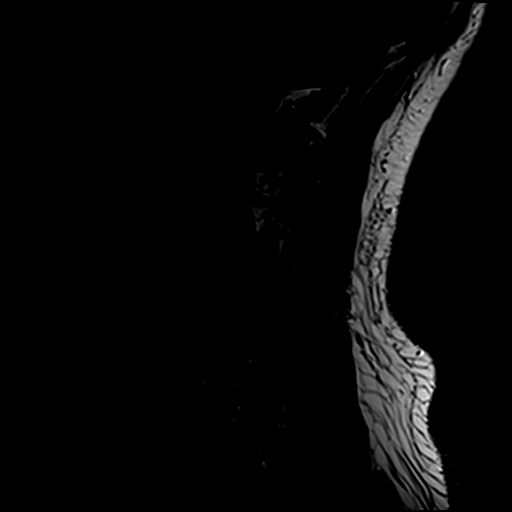

[Series 4: STIR · sagittal · 3.3mm · 0.82mm/px · 6 of 13 slices shown]
[im 1/13]
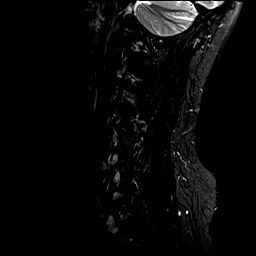
[im 3/13]
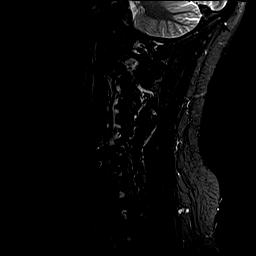
[im 5/13]
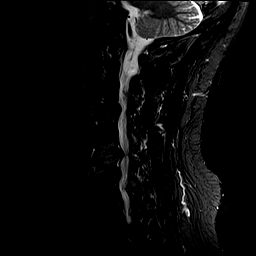
[im 8/13]
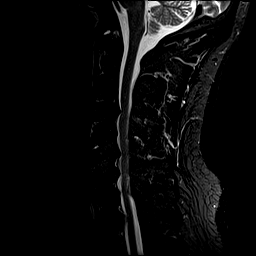
[im 10/13]
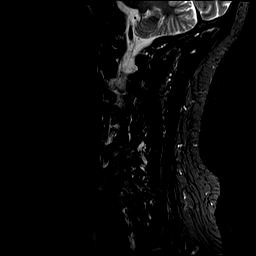
[im 13/13]
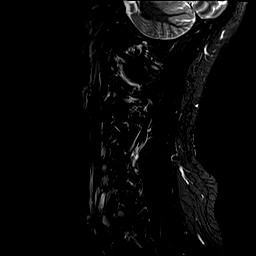

[Series 5: GRE · axial · 3.0mm · 0.35mm/px · z∈[-109,-93]mm · 2 of 31 slices shown]
[im 1/31]
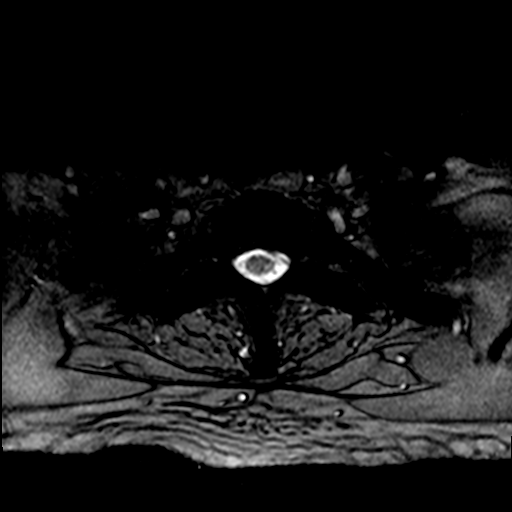
[im 5/31]
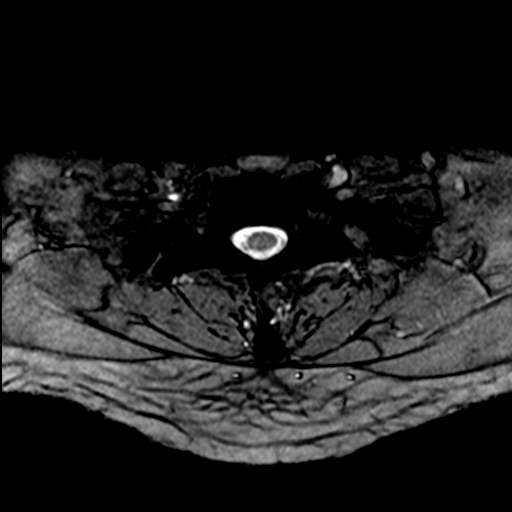

[Series 6: T2 · axial · 3.0mm · 0.70mm/px · z∈[-109,+4]mm · 9 of 31 slices shown (2 of 2)]
[im 1/31]
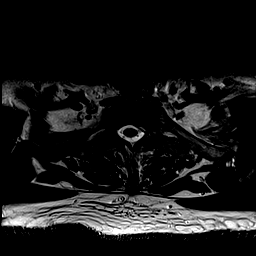
[im 5/31]
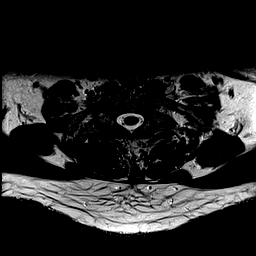
[im 9/31]
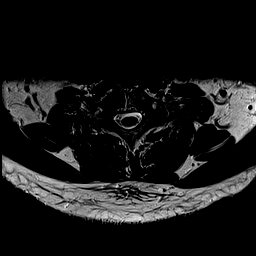
[im 13/31]
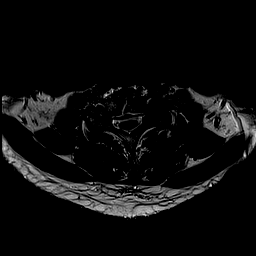
[im 16/31]
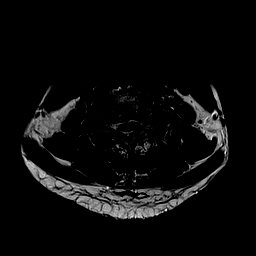
[im 18/31]
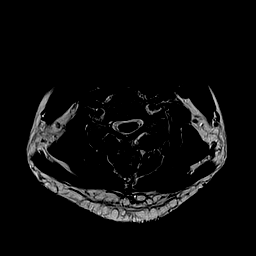
[im 22/31]
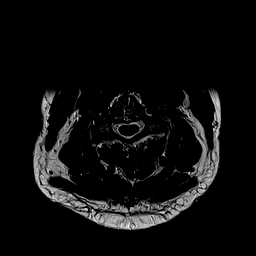
[im 26/31]
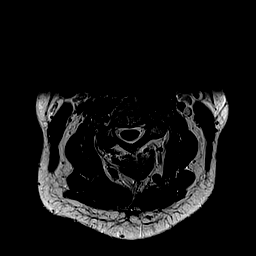
[im 31/31]
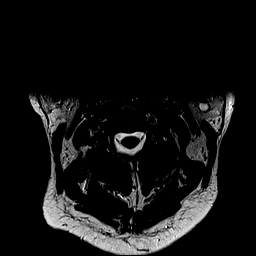

[29 of 48 positions shown; findings below may reference images not displayed]

FINDINGS: Alignment: Mild anterolisthesis C7-T1. Straightening of the cervical
lordosis

Vertebrae: Negative for fracture or mass

Cord: Normal spinal cord signal.

Posterior Fossa, vertebral arteries, paraspinal tissues: Negative

Disc levels:

C2-3: Negative

C3-4: Moderate disc degeneration and spondylosis with diffuse
uncinate spurring. Cord flattening with mild spinal stenosis.
Moderate left foraminal narrowing. Progression of spurring since the
prior MRI

C4-5: Small central disc protrusion without significant stenosis

C5-6: Advanced disc degeneration and diffuse uncinate spurring
bilaterally. Cord flattening with moderate spinal stenosis and
moderate foraminal stenosis. Progression of stenosis since the prior
MRI

C6-7: Advanced disc degeneration with diffuse uncinate spurring.
Mild spinal stenosis and moderate foraminal stenosis bilaterally

C7-T1: Mild anterolisthesis. Bilateral facet degeneration with
foraminal narrowing bilaterally

T1-2: Moderately large left-sided disc protrusion extending into the
foramen. Flattening of the cord on the left with left foraminal
encroachment
IMPRESSION: Mild spinal stenosis at C3-4 with moderate left foraminal narrowing
due to spurring

Small central disc protrusion C4-5

Advanced disc degeneration and spondylosis at C5-6. Cord flattening
with moderate spinal stenosis and moderate foraminal stenosis
bilaterally

Advanced disc degeneration and spondylosis at C6-7 with mild spinal
stenosis and moderate foraminal stenosis bilaterally

Bilateral facet degeneration and foraminal narrowing bilaterally at
C7-T1

Moderately large left-sided disc protrusion T1-2 extending into the
foramen.

## 2019-12-16 DIAGNOSIS — L811 Chloasma: Secondary | ICD-10-CM | POA: Diagnosis not present

## 2019-12-16 DIAGNOSIS — L918 Other hypertrophic disorders of the skin: Secondary | ICD-10-CM | POA: Diagnosis not present

## 2019-12-16 DIAGNOSIS — L570 Actinic keratosis: Secondary | ICD-10-CM | POA: Diagnosis not present

## 2019-12-17 DIAGNOSIS — R1012 Left upper quadrant pain: Secondary | ICD-10-CM | POA: Diagnosis not present

## 2019-12-17 DIAGNOSIS — Z8719 Personal history of other diseases of the digestive system: Secondary | ICD-10-CM | POA: Diagnosis not present

## 2020-08-09 DIAGNOSIS — M25562 Pain in left knee: Secondary | ICD-10-CM | POA: Diagnosis not present

## 2020-08-16 DIAGNOSIS — M25562 Pain in left knee: Secondary | ICD-10-CM | POA: Diagnosis not present

## 2020-08-25 DIAGNOSIS — M25562 Pain in left knee: Secondary | ICD-10-CM | POA: Diagnosis not present

## 2020-08-30 DIAGNOSIS — M25562 Pain in left knee: Secondary | ICD-10-CM | POA: Diagnosis not present

## 2020-09-07 DIAGNOSIS — M25562 Pain in left knee: Secondary | ICD-10-CM | POA: Diagnosis not present

## 2020-09-07 DIAGNOSIS — M542 Cervicalgia: Secondary | ICD-10-CM | POA: Diagnosis not present

## 2020-09-13 DIAGNOSIS — M25562 Pain in left knee: Secondary | ICD-10-CM | POA: Diagnosis not present

## 2020-09-13 DIAGNOSIS — M542 Cervicalgia: Secondary | ICD-10-CM | POA: Diagnosis not present

## 2020-09-19 DIAGNOSIS — M25562 Pain in left knee: Secondary | ICD-10-CM | POA: Diagnosis not present

## 2020-09-19 DIAGNOSIS — M542 Cervicalgia: Secondary | ICD-10-CM | POA: Diagnosis not present

## 2020-10-23 DIAGNOSIS — M25562 Pain in left knee: Secondary | ICD-10-CM | POA: Diagnosis not present

## 2020-10-23 DIAGNOSIS — M542 Cervicalgia: Secondary | ICD-10-CM | POA: Diagnosis not present

## 2020-10-25 DIAGNOSIS — M542 Cervicalgia: Secondary | ICD-10-CM | POA: Diagnosis not present

## 2020-10-25 DIAGNOSIS — M25562 Pain in left knee: Secondary | ICD-10-CM | POA: Diagnosis not present

## 2020-10-30 DIAGNOSIS — M542 Cervicalgia: Secondary | ICD-10-CM | POA: Diagnosis not present

## 2020-10-30 DIAGNOSIS — M25562 Pain in left knee: Secondary | ICD-10-CM | POA: Diagnosis not present

## 2020-11-01 DIAGNOSIS — M25562 Pain in left knee: Secondary | ICD-10-CM | POA: Diagnosis not present

## 2020-11-01 DIAGNOSIS — M542 Cervicalgia: Secondary | ICD-10-CM | POA: Diagnosis not present

## 2020-11-02 DIAGNOSIS — R7301 Impaired fasting glucose: Secondary | ICD-10-CM | POA: Diagnosis not present

## 2020-11-02 DIAGNOSIS — E785 Hyperlipidemia, unspecified: Secondary | ICD-10-CM | POA: Diagnosis not present

## 2020-11-02 DIAGNOSIS — Z125 Encounter for screening for malignant neoplasm of prostate: Secondary | ICD-10-CM | POA: Diagnosis not present

## 2020-11-08 DIAGNOSIS — M542 Cervicalgia: Secondary | ICD-10-CM | POA: Diagnosis not present

## 2020-11-08 DIAGNOSIS — M25562 Pain in left knee: Secondary | ICD-10-CM | POA: Diagnosis not present

## 2020-11-09 DIAGNOSIS — Z1331 Encounter for screening for depression: Secondary | ICD-10-CM | POA: Diagnosis not present

## 2020-11-09 DIAGNOSIS — R82998 Other abnormal findings in urine: Secondary | ICD-10-CM | POA: Diagnosis not present

## 2020-11-09 DIAGNOSIS — Z1339 Encounter for screening examination for other mental health and behavioral disorders: Secondary | ICD-10-CM | POA: Diagnosis not present

## 2020-11-09 DIAGNOSIS — E785 Hyperlipidemia, unspecified: Secondary | ICD-10-CM | POA: Diagnosis not present

## 2020-11-09 DIAGNOSIS — Z0000 Encounter for general adult medical examination without abnormal findings: Secondary | ICD-10-CM | POA: Diagnosis not present

## 2020-11-13 DIAGNOSIS — Z1212 Encounter for screening for malignant neoplasm of rectum: Secondary | ICD-10-CM | POA: Diagnosis not present

## 2021-09-27 DIAGNOSIS — M47812 Spondylosis without myelopathy or radiculopathy, cervical region: Secondary | ICD-10-CM | POA: Diagnosis not present

## 2021-10-02 DIAGNOSIS — M47812 Spondylosis without myelopathy or radiculopathy, cervical region: Secondary | ICD-10-CM | POA: Diagnosis not present

## 2021-10-11 DIAGNOSIS — M47812 Spondylosis without myelopathy or radiculopathy, cervical region: Secondary | ICD-10-CM | POA: Diagnosis not present

## 2021-10-18 DIAGNOSIS — M25562 Pain in left knee: Secondary | ICD-10-CM | POA: Diagnosis not present

## 2021-10-23 DIAGNOSIS — M47812 Spondylosis without myelopathy or radiculopathy, cervical region: Secondary | ICD-10-CM | POA: Diagnosis not present

## 2021-10-23 DIAGNOSIS — S83412D Sprain of medial collateral ligament of left knee, subsequent encounter: Secondary | ICD-10-CM | POA: Diagnosis not present

## 2021-10-25 DIAGNOSIS — S83412D Sprain of medial collateral ligament of left knee, subsequent encounter: Secondary | ICD-10-CM | POA: Diagnosis not present

## 2021-10-25 DIAGNOSIS — M47812 Spondylosis without myelopathy or radiculopathy, cervical region: Secondary | ICD-10-CM | POA: Diagnosis not present

## 2021-10-30 DIAGNOSIS — S83412D Sprain of medial collateral ligament of left knee, subsequent encounter: Secondary | ICD-10-CM | POA: Diagnosis not present

## 2021-10-30 DIAGNOSIS — M47812 Spondylosis without myelopathy or radiculopathy, cervical region: Secondary | ICD-10-CM | POA: Diagnosis not present

## 2021-11-01 DIAGNOSIS — S83412D Sprain of medial collateral ligament of left knee, subsequent encounter: Secondary | ICD-10-CM | POA: Diagnosis not present

## 2021-11-01 DIAGNOSIS — M47812 Spondylosis without myelopathy or radiculopathy, cervical region: Secondary | ICD-10-CM | POA: Diagnosis not present

## 2021-11-02 DIAGNOSIS — M25562 Pain in left knee: Secondary | ICD-10-CM | POA: Diagnosis not present

## 2021-11-06 DIAGNOSIS — S83412D Sprain of medial collateral ligament of left knee, subsequent encounter: Secondary | ICD-10-CM | POA: Diagnosis not present

## 2021-11-08 DIAGNOSIS — M47812 Spondylosis without myelopathy or radiculopathy, cervical region: Secondary | ICD-10-CM | POA: Diagnosis not present

## 2021-11-08 DIAGNOSIS — S83412D Sprain of medial collateral ligament of left knee, subsequent encounter: Secondary | ICD-10-CM | POA: Diagnosis not present

## 2021-11-14 DIAGNOSIS — S83412D Sprain of medial collateral ligament of left knee, subsequent encounter: Secondary | ICD-10-CM | POA: Diagnosis not present

## 2021-11-14 DIAGNOSIS — M47812 Spondylosis without myelopathy or radiculopathy, cervical region: Secondary | ICD-10-CM | POA: Diagnosis not present

## 2021-11-27 DIAGNOSIS — R7301 Impaired fasting glucose: Secondary | ICD-10-CM | POA: Diagnosis not present

## 2021-11-27 DIAGNOSIS — E785 Hyperlipidemia, unspecified: Secondary | ICD-10-CM | POA: Diagnosis not present

## 2021-11-27 DIAGNOSIS — Z125 Encounter for screening for malignant neoplasm of prostate: Secondary | ICD-10-CM | POA: Diagnosis not present

## 2021-11-28 DIAGNOSIS — R82998 Other abnormal findings in urine: Secondary | ICD-10-CM | POA: Diagnosis not present

## 2021-11-28 DIAGNOSIS — Z1331 Encounter for screening for depression: Secondary | ICD-10-CM | POA: Diagnosis not present

## 2021-11-28 DIAGNOSIS — R7301 Impaired fasting glucose: Secondary | ICD-10-CM | POA: Diagnosis not present

## 2021-11-28 DIAGNOSIS — Z1339 Encounter for screening examination for other mental health and behavioral disorders: Secondary | ICD-10-CM | POA: Diagnosis not present

## 2021-11-28 DIAGNOSIS — Z0000 Encounter for general adult medical examination without abnormal findings: Secondary | ICD-10-CM | POA: Diagnosis not present

## 2022-01-01 DIAGNOSIS — M47812 Spondylosis without myelopathy or radiculopathy, cervical region: Secondary | ICD-10-CM | POA: Diagnosis not present

## 2022-01-01 DIAGNOSIS — S83412D Sprain of medial collateral ligament of left knee, subsequent encounter: Secondary | ICD-10-CM | POA: Diagnosis not present

## 2022-01-23 DIAGNOSIS — S83412D Sprain of medial collateral ligament of left knee, subsequent encounter: Secondary | ICD-10-CM | POA: Diagnosis not present

## 2022-01-23 DIAGNOSIS — M47812 Spondylosis without myelopathy or radiculopathy, cervical region: Secondary | ICD-10-CM | POA: Diagnosis not present

## 2022-01-29 DIAGNOSIS — S83412D Sprain of medial collateral ligament of left knee, subsequent encounter: Secondary | ICD-10-CM | POA: Diagnosis not present

## 2022-01-29 DIAGNOSIS — M47812 Spondylosis without myelopathy or radiculopathy, cervical region: Secondary | ICD-10-CM | POA: Diagnosis not present

## 2022-01-31 DIAGNOSIS — M47812 Spondylosis without myelopathy or radiculopathy, cervical region: Secondary | ICD-10-CM | POA: Diagnosis not present

## 2022-01-31 DIAGNOSIS — S83412D Sprain of medial collateral ligament of left knee, subsequent encounter: Secondary | ICD-10-CM | POA: Diagnosis not present

## 2022-03-01 DIAGNOSIS — S83412D Sprain of medial collateral ligament of left knee, subsequent encounter: Secondary | ICD-10-CM | POA: Diagnosis not present

## 2022-03-11 DIAGNOSIS — S83412D Sprain of medial collateral ligament of left knee, subsequent encounter: Secondary | ICD-10-CM | POA: Diagnosis not present

## 2022-03-14 DIAGNOSIS — S83412D Sprain of medial collateral ligament of left knee, subsequent encounter: Secondary | ICD-10-CM | POA: Diagnosis not present

## 2022-03-19 DIAGNOSIS — S83412D Sprain of medial collateral ligament of left knee, subsequent encounter: Secondary | ICD-10-CM | POA: Diagnosis not present

## 2022-03-21 DIAGNOSIS — S83412D Sprain of medial collateral ligament of left knee, subsequent encounter: Secondary | ICD-10-CM | POA: Diagnosis not present

## 2022-03-25 DIAGNOSIS — S83412D Sprain of medial collateral ligament of left knee, subsequent encounter: Secondary | ICD-10-CM | POA: Diagnosis not present

## 2022-04-01 DIAGNOSIS — H52223 Regular astigmatism, bilateral: Secondary | ICD-10-CM | POA: Diagnosis not present

## 2022-04-04 DIAGNOSIS — S83412D Sprain of medial collateral ligament of left knee, subsequent encounter: Secondary | ICD-10-CM | POA: Diagnosis not present

## 2022-06-12 DIAGNOSIS — M47812 Spondylosis without myelopathy or radiculopathy, cervical region: Secondary | ICD-10-CM | POA: Diagnosis not present

## 2022-06-18 DIAGNOSIS — M47812 Spondylosis without myelopathy or radiculopathy, cervical region: Secondary | ICD-10-CM | POA: Diagnosis not present

## 2022-08-14 DIAGNOSIS — L578 Other skin changes due to chronic exposure to nonionizing radiation: Secondary | ICD-10-CM | POA: Diagnosis not present

## 2022-08-14 DIAGNOSIS — C44311 Basal cell carcinoma of skin of nose: Secondary | ICD-10-CM | POA: Diagnosis not present

## 2022-08-14 DIAGNOSIS — L570 Actinic keratosis: Secondary | ICD-10-CM | POA: Diagnosis not present

## 2022-08-14 DIAGNOSIS — L821 Other seborrheic keratosis: Secondary | ICD-10-CM | POA: Diagnosis not present

## 2022-08-15 DIAGNOSIS — M2242 Chondromalacia patellae, left knee: Secondary | ICD-10-CM | POA: Diagnosis not present

## 2022-08-19 DIAGNOSIS — M2242 Chondromalacia patellae, left knee: Secondary | ICD-10-CM | POA: Diagnosis not present

## 2022-08-26 DIAGNOSIS — M2242 Chondromalacia patellae, left knee: Secondary | ICD-10-CM | POA: Diagnosis not present

## 2022-12-20 DIAGNOSIS — E785 Hyperlipidemia, unspecified: Secondary | ICD-10-CM | POA: Diagnosis not present

## 2022-12-20 DIAGNOSIS — Z125 Encounter for screening for malignant neoplasm of prostate: Secondary | ICD-10-CM | POA: Diagnosis not present

## 2022-12-20 DIAGNOSIS — R7301 Impaired fasting glucose: Secondary | ICD-10-CM | POA: Diagnosis not present

## 2022-12-30 ENCOUNTER — Other Ambulatory Visit: Payer: Self-pay | Admitting: Internal Medicine

## 2022-12-30 DIAGNOSIS — E785 Hyperlipidemia, unspecified: Secondary | ICD-10-CM

## 2023-01-08 DIAGNOSIS — Z1331 Encounter for screening for depression: Secondary | ICD-10-CM | POA: Diagnosis not present

## 2023-01-08 DIAGNOSIS — Z1339 Encounter for screening examination for other mental health and behavioral disorders: Secondary | ICD-10-CM | POA: Diagnosis not present

## 2023-01-08 DIAGNOSIS — R7301 Impaired fasting glucose: Secondary | ICD-10-CM | POA: Diagnosis not present

## 2023-01-08 DIAGNOSIS — Z0000 Encounter for general adult medical examination without abnormal findings: Secondary | ICD-10-CM | POA: Diagnosis not present

## 2023-01-08 DIAGNOSIS — R82998 Other abnormal findings in urine: Secondary | ICD-10-CM | POA: Diagnosis not present

## 2023-01-17 ENCOUNTER — Ambulatory Visit
Admission: RE | Admit: 2023-01-17 | Discharge: 2023-01-17 | Disposition: A | Discharge status: Home / Self Care | Payer: No Typology Code available for payment source | Source: Ambulatory Visit | Attending: Internal Medicine | Admitting: Internal Medicine

## 2023-01-17 DIAGNOSIS — E785 Hyperlipidemia, unspecified: Secondary | ICD-10-CM

## 2023-01-17 DIAGNOSIS — I2510 Atherosclerotic heart disease of native coronary artery without angina pectoris: Secondary | ICD-10-CM | POA: Diagnosis not present

## 2023-02-06 DIAGNOSIS — M25532 Pain in left wrist: Secondary | ICD-10-CM | POA: Diagnosis not present

## 2023-02-13 DIAGNOSIS — M25532 Pain in left wrist: Secondary | ICD-10-CM | POA: Diagnosis not present

## 2023-02-13 DIAGNOSIS — M25531 Pain in right wrist: Secondary | ICD-10-CM | POA: Diagnosis not present

## 2023-07-07 DIAGNOSIS — R6884 Jaw pain: Secondary | ICD-10-CM | POA: Diagnosis not present

## 2023-07-07 DIAGNOSIS — M542 Cervicalgia: Secondary | ICD-10-CM | POA: Diagnosis not present

## 2023-07-10 DIAGNOSIS — R6884 Jaw pain: Secondary | ICD-10-CM | POA: Diagnosis not present

## 2023-07-10 DIAGNOSIS — M542 Cervicalgia: Secondary | ICD-10-CM | POA: Diagnosis not present

## 2023-07-14 DIAGNOSIS — R6884 Jaw pain: Secondary | ICD-10-CM | POA: Diagnosis not present

## 2023-07-14 DIAGNOSIS — M542 Cervicalgia: Secondary | ICD-10-CM | POA: Diagnosis not present

## 2023-07-24 DIAGNOSIS — M47812 Spondylosis without myelopathy or radiculopathy, cervical region: Secondary | ICD-10-CM | POA: Diagnosis not present

## 2023-07-29 DIAGNOSIS — M47812 Spondylosis without myelopathy or radiculopathy, cervical region: Secondary | ICD-10-CM | POA: Diagnosis not present

## 2023-09-11 DIAGNOSIS — M47812 Spondylosis without myelopathy or radiculopathy, cervical region: Secondary | ICD-10-CM | POA: Diagnosis not present

## 2023-10-08 DIAGNOSIS — M2041 Other hammer toe(s) (acquired), right foot: Secondary | ICD-10-CM | POA: Diagnosis not present

## 2023-10-08 DIAGNOSIS — L603 Nail dystrophy: Secondary | ICD-10-CM | POA: Diagnosis not present

## 2023-10-27 DIAGNOSIS — H52223 Regular astigmatism, bilateral: Secondary | ICD-10-CM | POA: Diagnosis not present

## 2023-10-27 DIAGNOSIS — D23121 Other benign neoplasm of skin of left upper eyelid, including canthus: Secondary | ICD-10-CM | POA: Diagnosis not present

## 2023-11-19 DIAGNOSIS — M47812 Spondylosis without myelopathy or radiculopathy, cervical region: Secondary | ICD-10-CM | POA: Diagnosis not present

## 2023-11-25 DIAGNOSIS — M47812 Spondylosis without myelopathy or radiculopathy, cervical region: Secondary | ICD-10-CM | POA: Diagnosis not present

## 2023-12-10 DIAGNOSIS — M47812 Spondylosis without myelopathy or radiculopathy, cervical region: Secondary | ICD-10-CM | POA: Diagnosis not present

## 2023-12-16 DIAGNOSIS — M47812 Spondylosis without myelopathy or radiculopathy, cervical region: Secondary | ICD-10-CM | POA: Diagnosis not present

## 2024-01-09 DIAGNOSIS — Z6379 Other stressful life events affecting family and household: Secondary | ICD-10-CM | POA: Diagnosis not present

## 2024-01-09 DIAGNOSIS — F439 Reaction to severe stress, unspecified: Secondary | ICD-10-CM | POA: Diagnosis not present

## 2024-01-09 DIAGNOSIS — Z62820 Parent-biological child conflict: Secondary | ICD-10-CM | POA: Diagnosis not present

## 2024-01-09 DIAGNOSIS — F4322 Adjustment disorder with anxiety: Secondary | ICD-10-CM | POA: Diagnosis not present

## 2024-01-15 DIAGNOSIS — E785 Hyperlipidemia, unspecified: Secondary | ICD-10-CM | POA: Diagnosis not present

## 2024-01-15 DIAGNOSIS — R7301 Impaired fasting glucose: Secondary | ICD-10-CM | POA: Diagnosis not present

## 2024-01-15 DIAGNOSIS — Z125 Encounter for screening for malignant neoplasm of prostate: Secondary | ICD-10-CM | POA: Diagnosis not present

## 2024-01-22 DIAGNOSIS — Z1331 Encounter for screening for depression: Secondary | ICD-10-CM | POA: Diagnosis not present

## 2024-01-22 DIAGNOSIS — F439 Reaction to severe stress, unspecified: Secondary | ICD-10-CM | POA: Diagnosis not present

## 2024-01-22 DIAGNOSIS — Z6379 Other stressful life events affecting family and household: Secondary | ICD-10-CM | POA: Diagnosis not present

## 2024-01-22 DIAGNOSIS — I471 Supraventricular tachycardia, unspecified: Secondary | ICD-10-CM | POA: Diagnosis not present

## 2024-01-22 DIAGNOSIS — F4322 Adjustment disorder with anxiety: Secondary | ICD-10-CM | POA: Diagnosis not present

## 2024-01-22 DIAGNOSIS — Z1339 Encounter for screening examination for other mental health and behavioral disorders: Secondary | ICD-10-CM | POA: Diagnosis not present

## 2024-01-22 DIAGNOSIS — R82998 Other abnormal findings in urine: Secondary | ICD-10-CM | POA: Diagnosis not present

## 2024-01-22 DIAGNOSIS — Z62820 Parent-biological child conflict: Secondary | ICD-10-CM | POA: Diagnosis not present

## 2024-01-22 DIAGNOSIS — Z0000 Encounter for general adult medical examination without abnormal findings: Secondary | ICD-10-CM | POA: Diagnosis not present

## 2024-05-12 DIAGNOSIS — L578 Other skin changes due to chronic exposure to nonionizing radiation: Secondary | ICD-10-CM | POA: Diagnosis not present

## 2024-05-12 DIAGNOSIS — C44311 Basal cell carcinoma of skin of nose: Secondary | ICD-10-CM | POA: Diagnosis not present

## 2024-05-12 DIAGNOSIS — L821 Other seborrheic keratosis: Secondary | ICD-10-CM | POA: Diagnosis not present
